# Patient Record
Sex: Male | Born: 1980 | Race: Black or African American | Hispanic: No | Marital: Single | Smoking: Never smoker
Health system: Southern US, Community
[De-identification: ages and names within clinical notes are randomized; demographics above are authoritative.]

## PROBLEM LIST (undated history)

## (undated) DIAGNOSIS — J45909 Unspecified asthma, uncomplicated: Secondary | ICD-10-CM

## (undated) DIAGNOSIS — M549 Dorsalgia, unspecified: Secondary | ICD-10-CM

## (undated) DEATH — deceased

---

## 2013-02-16 ENCOUNTER — Emergency Department: Payer: Self-pay | Admitting: Emergency Medicine

## 2013-03-13 ENCOUNTER — Emergency Department: Payer: Self-pay | Admitting: Emergency Medicine

## 2013-05-02 ENCOUNTER — Emergency Department: Payer: Self-pay | Admitting: Emergency Medicine

## 2013-06-12 ENCOUNTER — Emergency Department: Payer: Self-pay | Admitting: Emergency Medicine

## 2013-06-12 LAB — URINALYSIS, COMPLETE
Bilirubin,UR: NEGATIVE
Glucose,UR: NEGATIVE mg/dL (ref 0–75)
Ketone: NEGATIVE
Protein: NEGATIVE
RBC,UR: NONE SEEN /HPF (ref 0–5)
Specific Gravity: 1.03 (ref 1.003–1.030)
Squamous Epithelial: <1

## 2013-06-12 LAB — CBC
HCT: 42.9 % (ref 40.0–52.0)
HGB: 15 g/dL (ref 13.0–18.0)
MCH: 30.4 pg (ref 26.0–34.0)
MCHC: 34.9 g/dL (ref 32.0–36.0)
Platelet: 248 10*3/uL (ref 150–440)
RDW: 13 % (ref 11.5–14.5)
WBC: 7.8 10*3/uL (ref 3.8–10.6)

## 2013-06-12 LAB — COMPREHENSIVE METABOLIC PANEL
Albumin: 4.1 g/dL (ref 3.4–5.0)
Alkaline Phosphatase: 75 U/L (ref 50–136)
Anion Gap: 10 (ref 7–16)
BUN: 20 mg/dL — ABNORMAL HIGH (ref 7–18)
Bilirubin,Total: 0.6 mg/dL (ref 0.2–1.0)
Calcium, Total: 9.1 mg/dL (ref 8.5–10.1)
Chloride: 105 mmol/L (ref 98–107)
Co2: 26 mmol/L (ref 21–32)
Creatinine: 1.18 mg/dL (ref 0.60–1.30)
EGFR (African American): >60
EGFR (Non-African Amer.): >60
Glucose: 107 mg/dL — ABNORMAL HIGH (ref 65–99)
Potassium: 3.6 mmol/L (ref 3.5–5.1)
SGOT(AST): 27 U/L (ref 15–37)
Sodium: 141 mmol/L (ref 136–145)
Total Protein: 7.1 g/dL (ref 6.4–8.2)

## 2013-06-12 LAB — LIPASE, BLOOD: Lipase: 881 U/L — ABNORMAL HIGH (ref 73–393)

## 2013-07-19 ENCOUNTER — Emergency Department: Payer: Self-pay | Admitting: Emergency Medicine

## 2013-08-15 ENCOUNTER — Emergency Department: Payer: Self-pay | Admitting: Emergency Medicine

## 2013-09-23 ENCOUNTER — Emergency Department: Payer: Self-pay | Admitting: Internal Medicine

## 2013-09-23 ENCOUNTER — Emergency Department: Payer: Self-pay | Admitting: Emergency Medicine

## 2013-09-29 ENCOUNTER — Emergency Department: Payer: Self-pay | Admitting: Emergency Medicine

## 2013-10-21 ENCOUNTER — Emergency Department: Payer: Self-pay | Admitting: Emergency Medicine

## 2013-10-21 LAB — BASIC METABOLIC PANEL
Calcium, Total: 8.8 mg/dL (ref 8.5–10.1)
Chloride: 109 mmol/L — ABNORMAL HIGH (ref 98–107)
Co2: 26 mmol/L (ref 21–32)
Creatinine: 1.06 mg/dL (ref 0.60–1.30)
EGFR (African American): >60
EGFR (Non-African Amer.): >60
Osmolality: 281 (ref 275–301)
Potassium: 3.7 mmol/L (ref 3.5–5.1)
Sodium: 141 mmol/L (ref 136–145)

## 2013-10-21 LAB — CBC
HCT: 42.2 % (ref 40.0–52.0)
HGB: 14.6 g/dL (ref 13.0–18.0)
MCHC: 34.6 g/dL (ref 32.0–36.0)
Platelet: 266 10*3/uL (ref 150–440)
WBC: 8.1 10*3/uL (ref 3.8–10.6)

## 2013-10-21 LAB — TROPONIN I: Troponin-I: <0.02 ng/mL

## 2013-11-17 ENCOUNTER — Emergency Department: Payer: Self-pay | Admitting: Emergency Medicine

## 2013-12-27 ENCOUNTER — Emergency Department: Payer: Self-pay | Admitting: Emergency Medicine

## 2014-06-10 ENCOUNTER — Emergency Department: Payer: Self-pay | Admitting: Emergency Medicine

## 2014-06-11 ENCOUNTER — Emergency Department: Payer: Self-pay | Admitting: Emergency Medicine

## 2014-07-02 IMAGING — CR DG CHEST 2V
1 series · 3 of 3 positions shown · non-contrast
Comparison: none

REASON FOR EXAM: productive cough
COMMENTS:

PROCEDURE:     DXR - DXR CHEST PA (OR AP) AND LATERAL  - May 02, 2013  [DATE]
RESULT:     The lungs are clear. The cardiac silhouette and visualized bony
skeleton are unremarkable.

[Series 1: pa · 0.17mm/px · 3 of 3 slices shown]
[im 1/3]
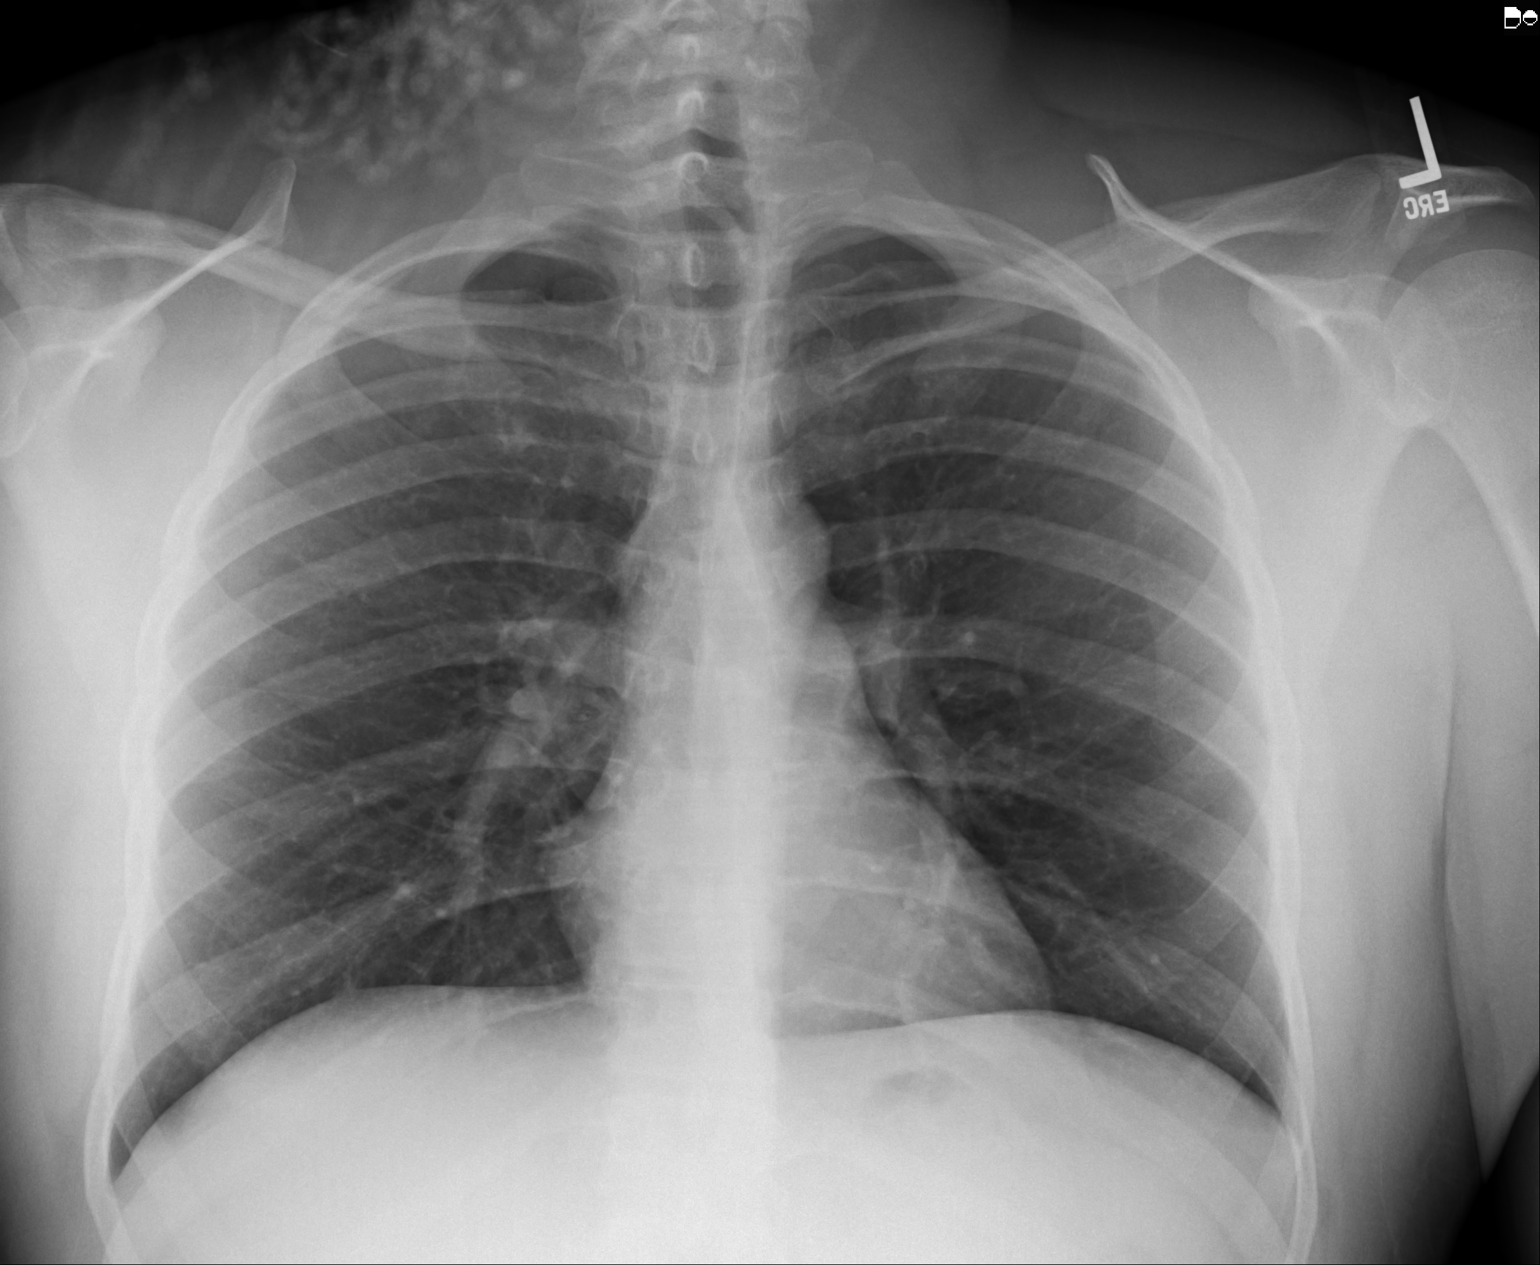
[im 2/3]
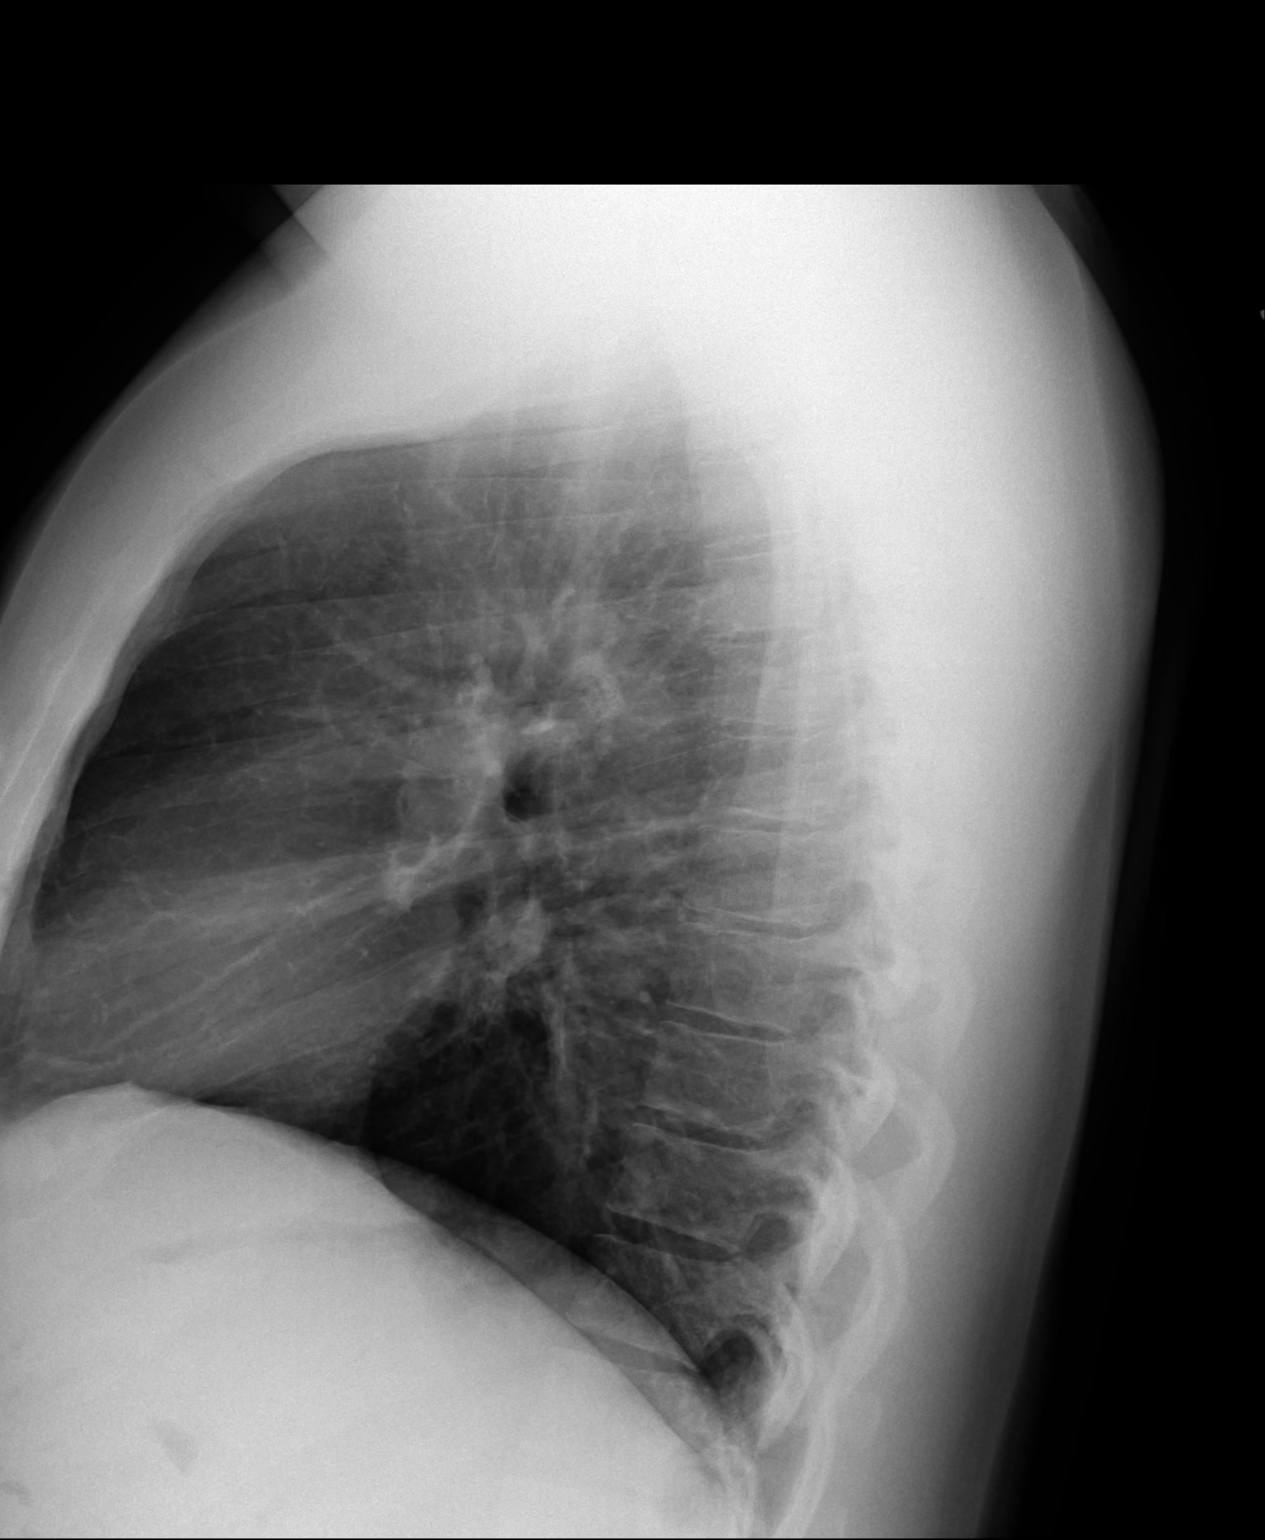
[im 3/3]
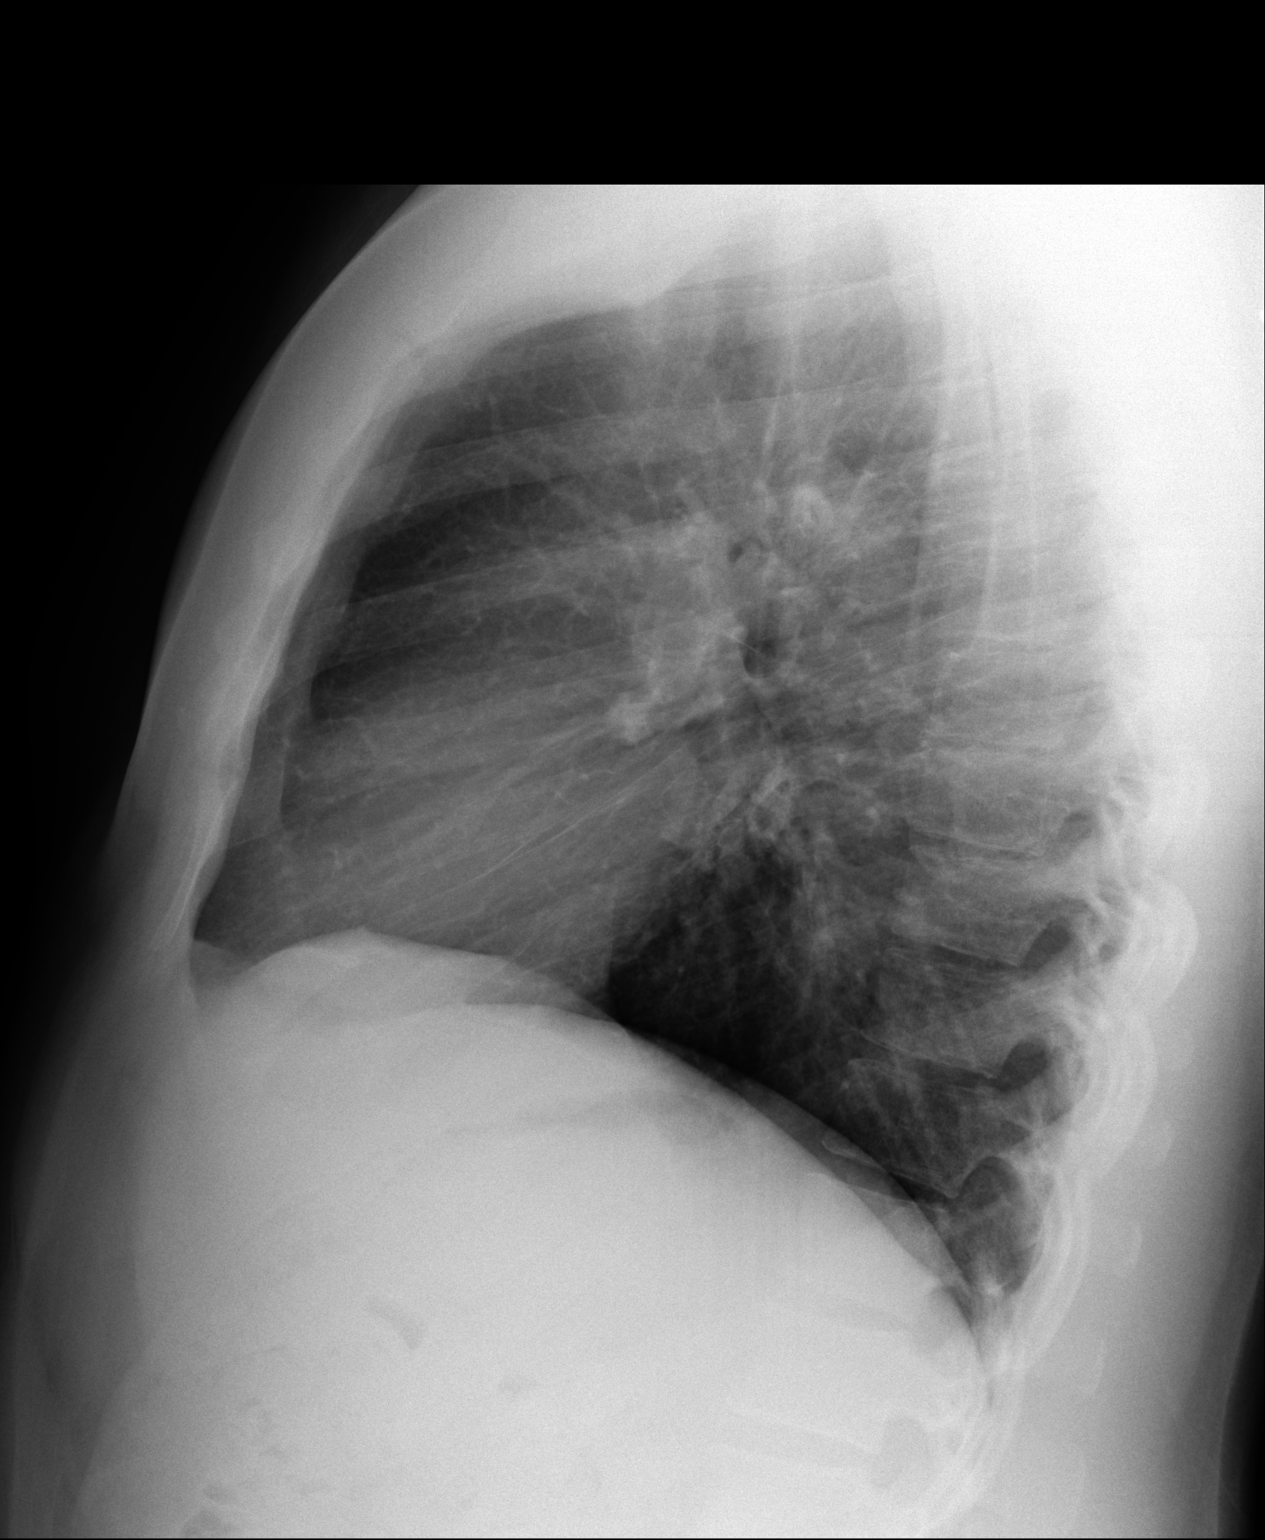

[3 of 3 positions shown; findings below may reference images not displayed]

IMPRESSION: 1. Chest radiograph without evidence of acute cardiopulmonary disease.

## 2014-11-29 IMAGING — CR DG CHEST 2V
1 series · 3 of 3 positions shown · non-contrast
Comparison: none

REASON FOR EXAM: wheezing    flex 41
COMMENTS:   LMP: (Male)

PROCEDURE:     DXR - DXR CHEST PA (OR AP) AND LATERAL  - September 29, 2013  [DATE]
RESULT:     Comparison: 05/02/2013

[Series 1: pa · 0.17mm/px · 3 of 3 slices shown]
[im 1/3]
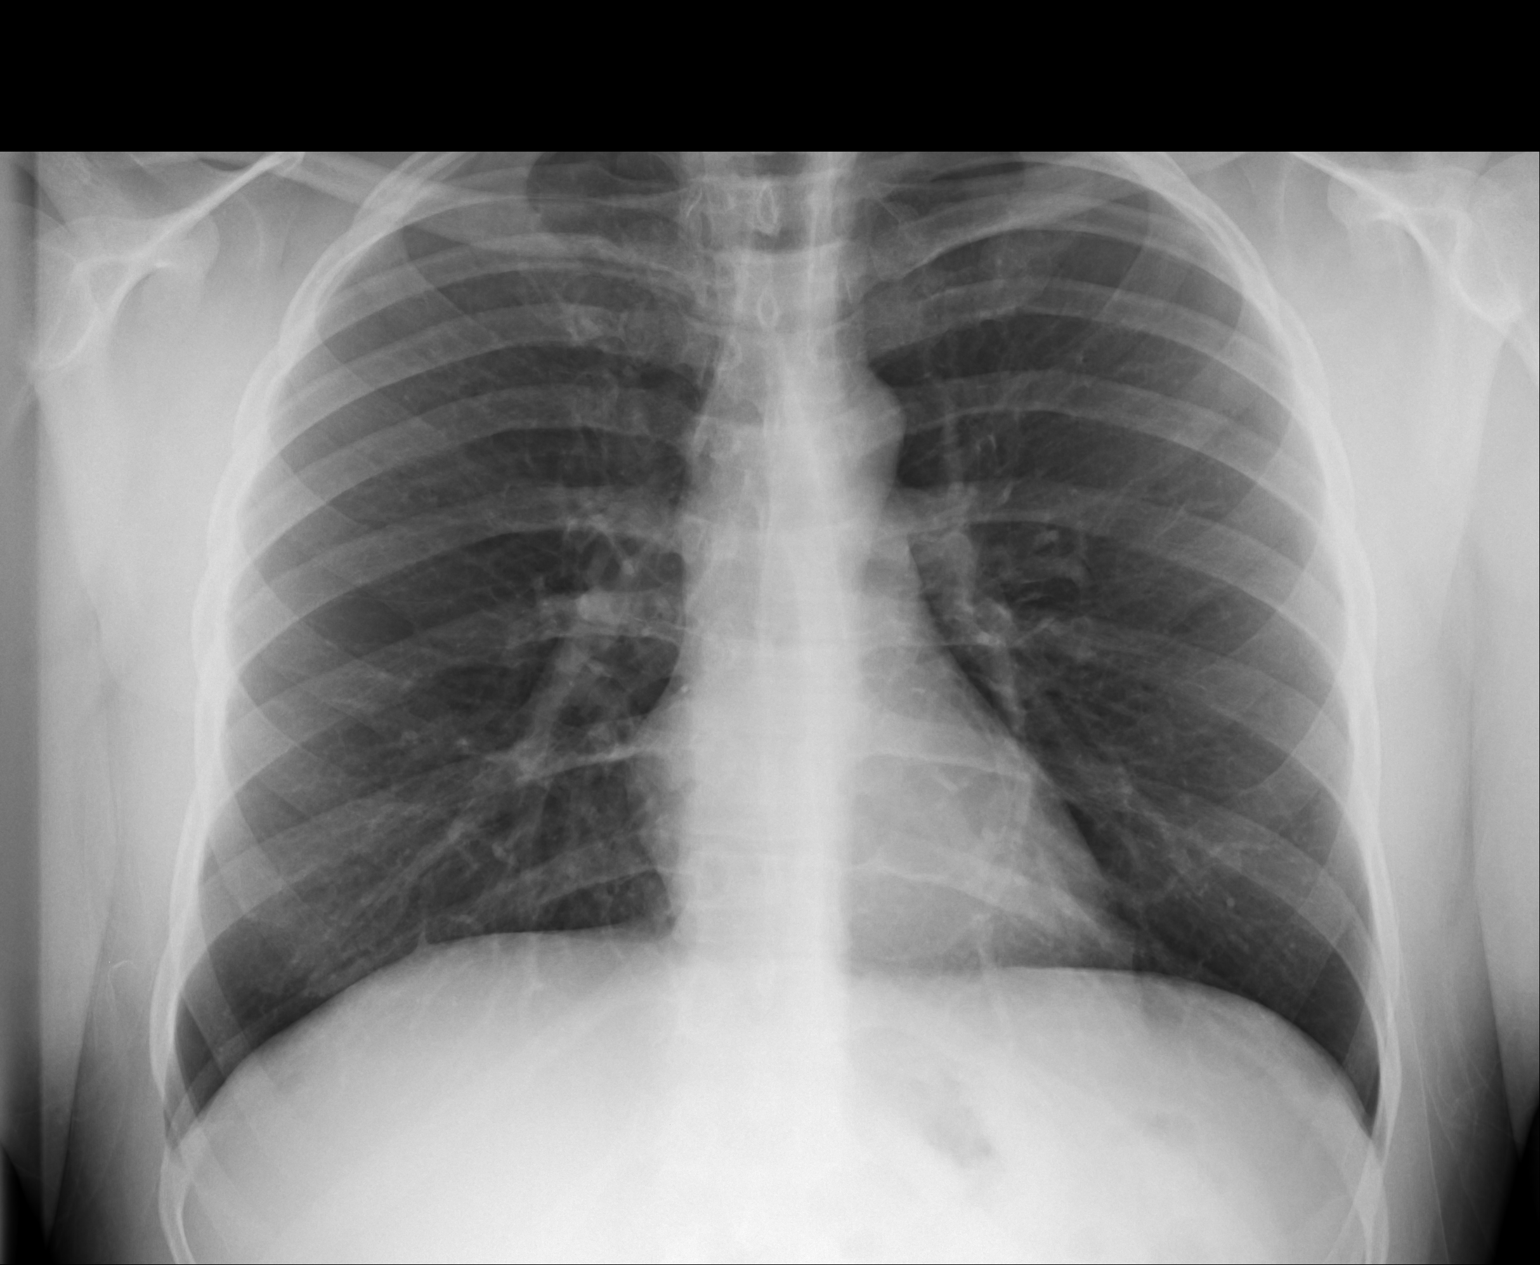
[im 2/3]
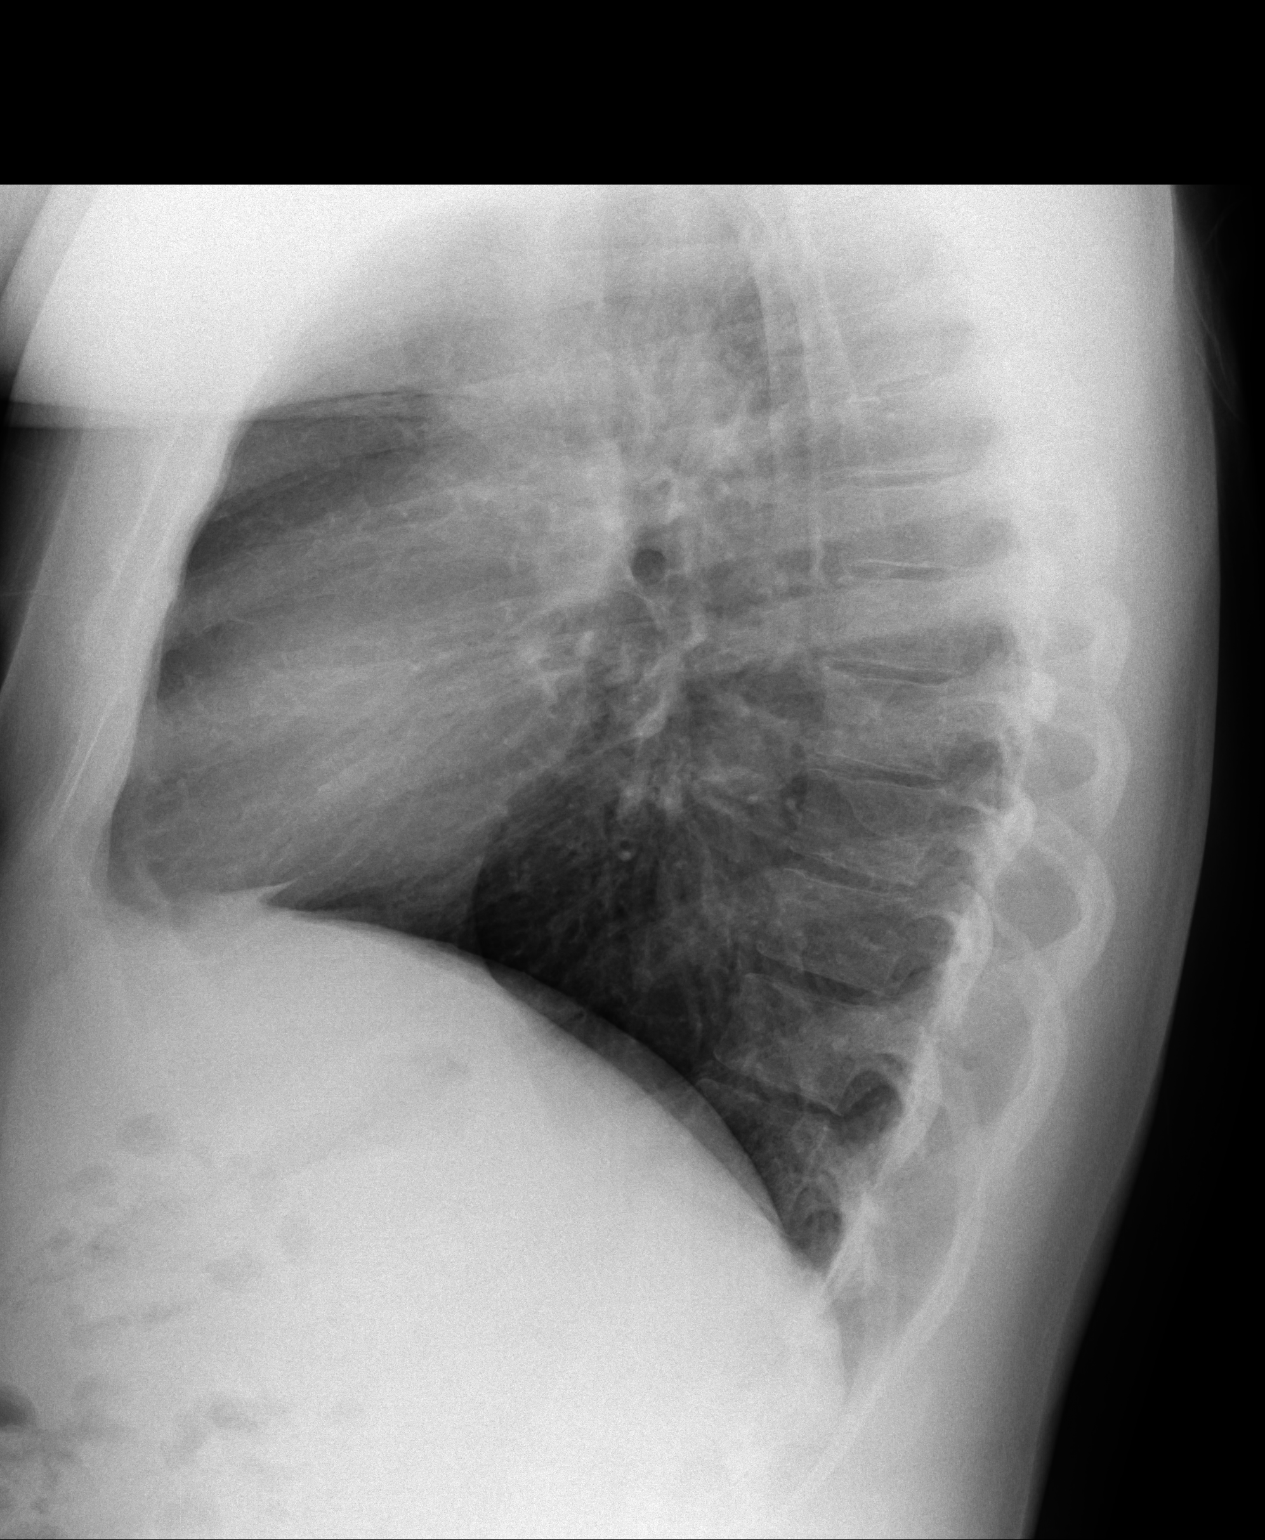
[im 3/3]
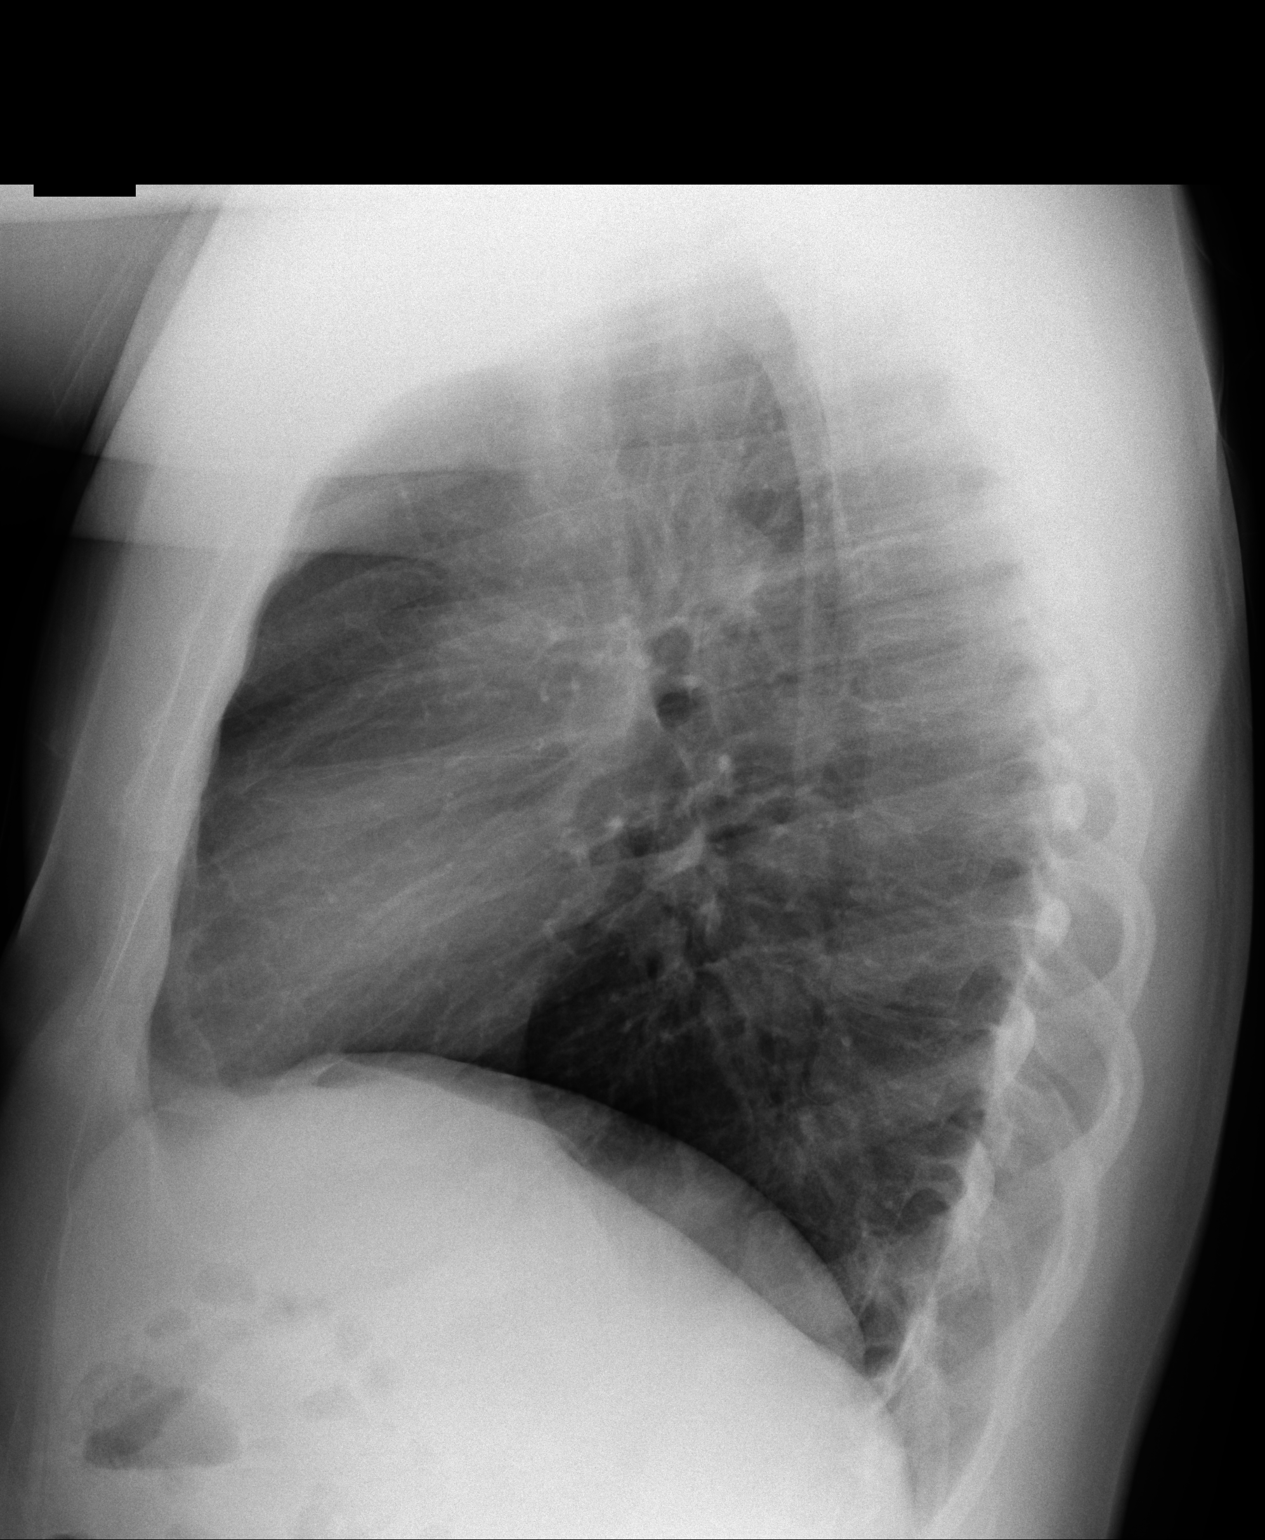

[3 of 3 positions shown; findings below may reference images not displayed]

FINDINGS: PA and lateral chest radiographs are provided.  There is no focal
parenchymal opacity, pleural effusion, or pneumothorax. The heart and
mediastinum are unremarkable.  The osseous structures are unremarkable.
IMPRESSION: No acute disease of the che[REDACTED]

## 2014-12-21 IMAGING — CR DG CHEST 2V
1 series · 2 of 2 positions shown · non-contrast
Comparison: none

REASON FOR EXAM: dyspnea
COMMENTS:

PROCEDURE:     DXR - DXR CHEST PA (OR AP) AND LATERAL  - October 21, 2013 [DATE]
RESULT:     The lungs are clear. The cardiac silhouette and visualized bony
skeleton are unremarkable.

[Series 1: w chest pa · 0.14mm/px · 2 of 2 slices shown]
[im 1/2]
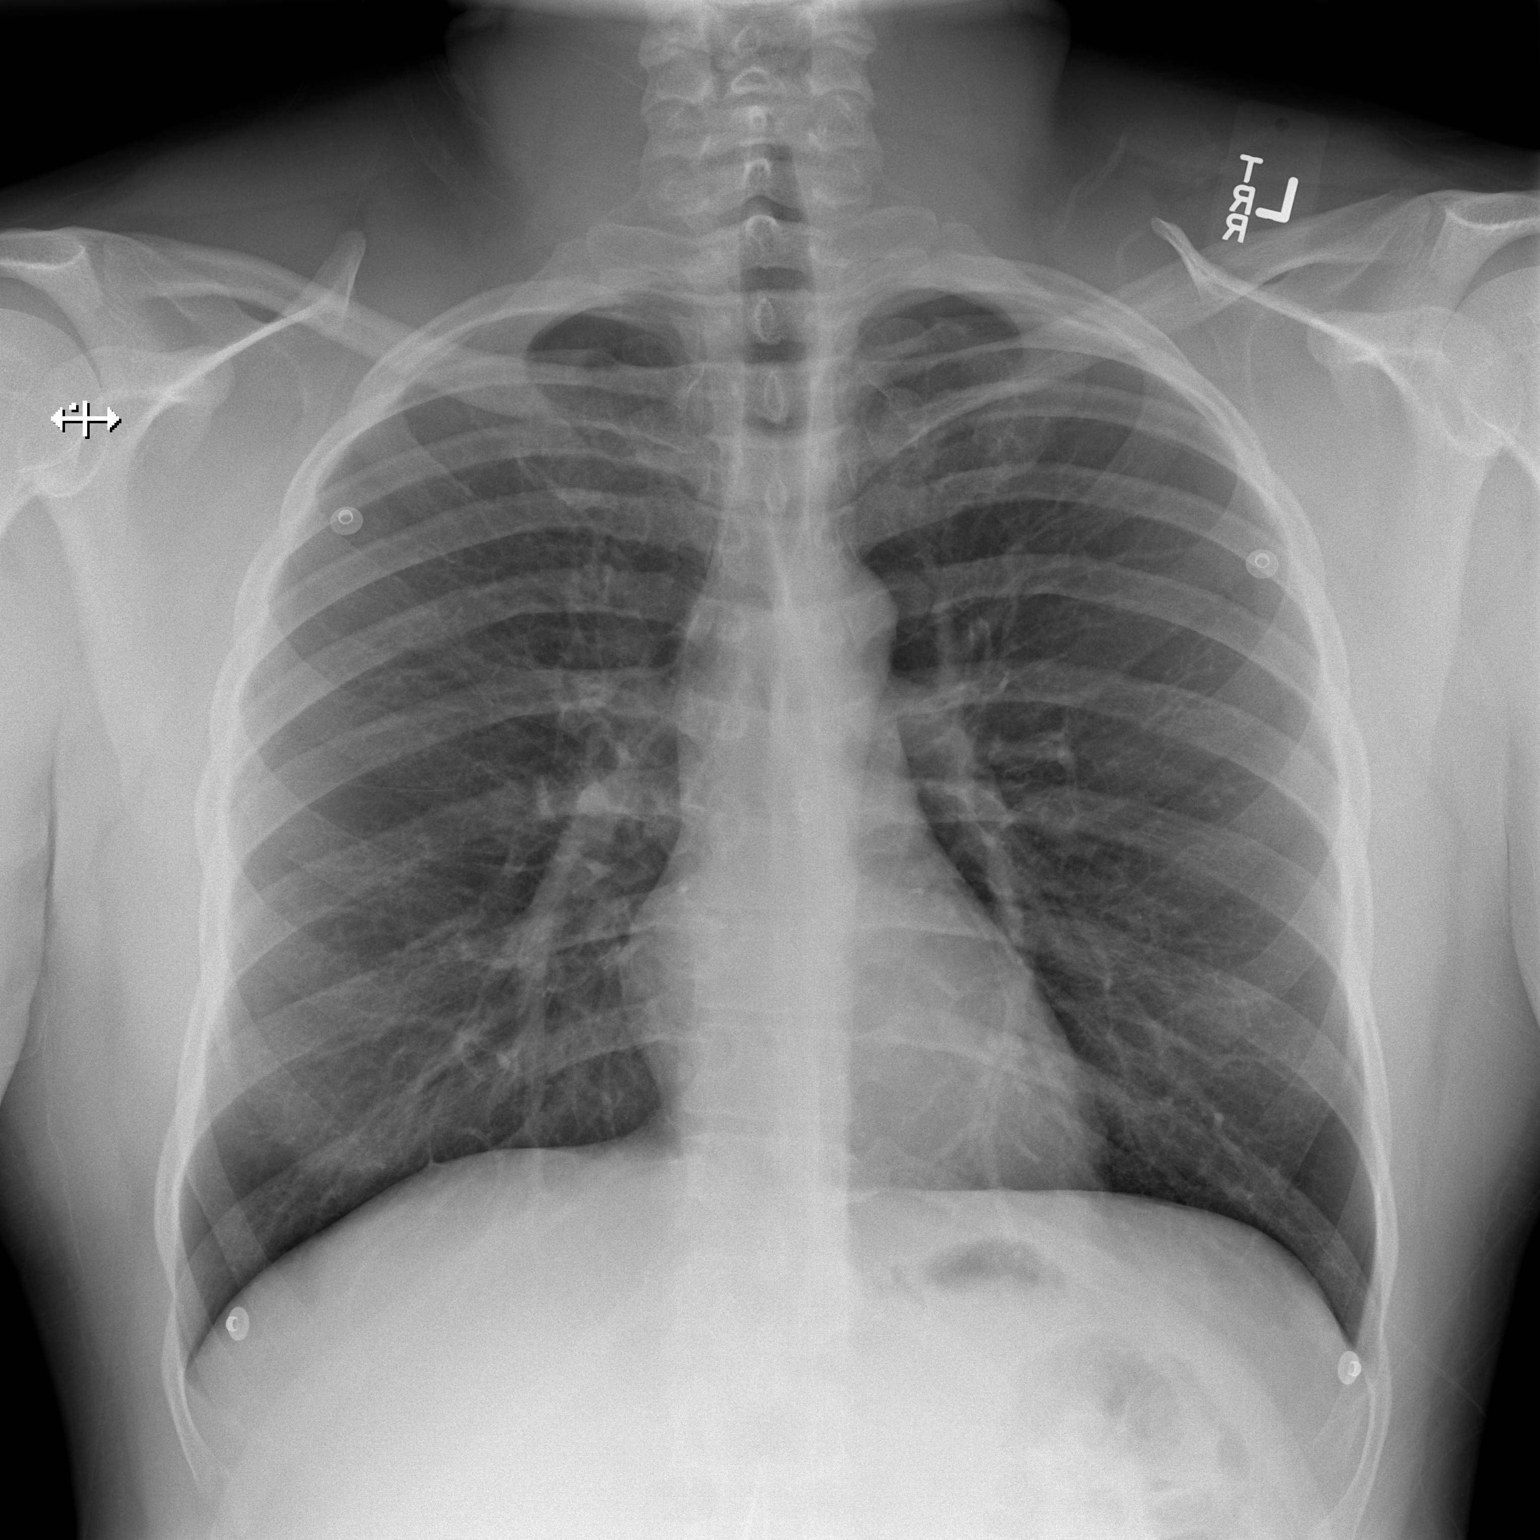
[im 2/2]
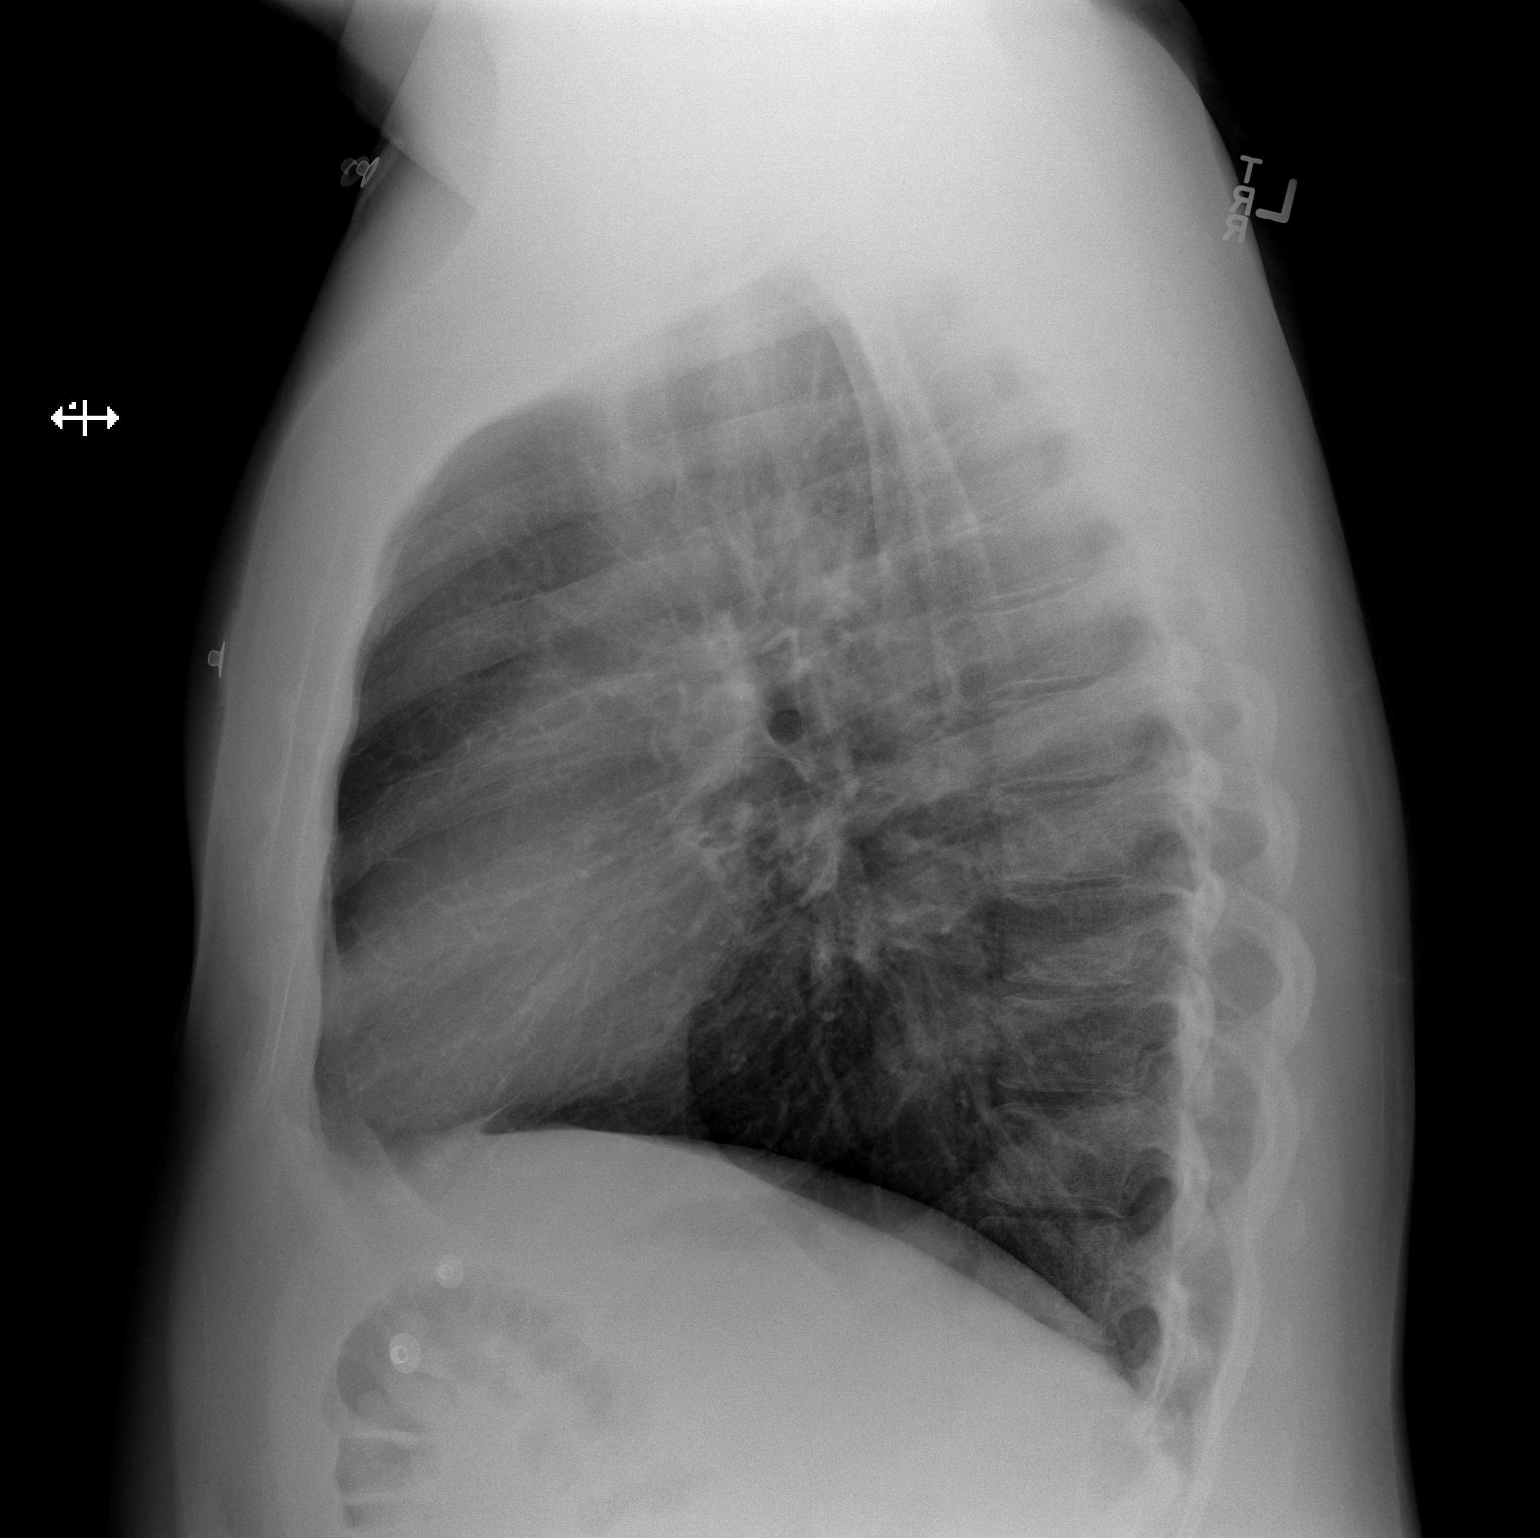

[2 of 2 positions shown; findings below may reference images not displayed]

IMPRESSION: 1. Chest radiograph without evidence of acute cardiopulmonary disease.
2. Comparison 09/29/2013

## 2015-09-09 ENCOUNTER — Encounter (HOSPITAL_COMMUNITY): Payer: Self-pay | Admitting: Emergency Medicine

## 2015-09-09 ENCOUNTER — Emergency Department (HOSPITAL_COMMUNITY)
Admission: EM | Admit: 2015-09-09 | Discharge: 2015-09-09 | Disposition: A | Discharge status: Home / Self Care | Payer: Self-pay | Attending: Emergency Medicine | Admitting: Emergency Medicine

## 2015-09-09 DIAGNOSIS — J45901 Unspecified asthma with (acute) exacerbation: Secondary | ICD-10-CM | POA: Insufficient documentation

## 2015-09-09 HISTORY — DX: Dorsalgia, unspecified: M54.9

## 2015-09-09 HISTORY — DX: Unspecified asthma, uncomplicated: J45.909

## 2015-09-09 MED ORDER — ALBUTEROL SULFATE HFA 108 (90 BASE) MCG/ACT IN AERS
2.0000 | INHALATION_SPRAY | RESPIRATORY_TRACT | Status: DC | PRN
  Administered 2015-09-09: 2 via RESPIRATORY_TRACT
  Filled 2015-09-09: qty 6.7

## 2015-09-09 NOTE — ED Provider Notes (Signed)
CSN: [id number]     Arrival date & time 09/09/15  1446 History  This chart was scribed for Rhea Bleacher, working with Eber Hong, MD by Chestine Spore, ED Scribe. The patient was seen in room TR06C/TR06C at 3:09 PM.    Chief Complaint  Patient presents with  . Asthma    The history is provided by the patient. No language interpreter was used.    HPI Comments: John Duncan is a 34 y.o. male with a medical hx of asthma who presents to the Emergency Department complaining of asthma onset 2-3 AM this morning. He reports that he is out of his albuterol inhaler and he would like a new one today. He reports that he has a Rx for an inhaler and prednisone but he left it at home before travel. He states that he is having associated symptoms of chest wall tightness, productive cough. He denies rhinorhea, sore throat, fever, congestion, and any other symptoms.   Past Medical History  Diagnosis Date  . Asthma   . Back pain    History reviewed. No pertinent past surgical history. No family history on file. Social History  Substance Use Topics  . Smoking status: Never Smoker   . Smokeless tobacco: None  . Alcohol Use: No    Review of Systems  Constitutional: Negative for fever.  HENT: Negative for congestion, rhinorrhea and sore throat.   Eyes: Negative for redness.  Respiratory: Positive for cough. Negative for shortness of breath.   Cardiovascular: Negative for chest pain.  Gastrointestinal: Negative for nausea and vomiting.      Allergies  Review of patient's allergies indicates no known allergies.  Home Medications   Prior to Admission medications   Not on File   BP 118/67 mmHg  Pulse 61  Temp(Src) 98.3 F (36.8 C) (Oral)  Resp 18  Ht  (1.753 m)  Wt 236 lb (107.049 kg)  BMI 34.84 kg/m2  SpO2 98%   Physical Exam  Constitutional: He appears well-developed and well-nourished.  HENT:  Head: Normocephalic and atraumatic.  Eyes: Conjunctivae are normal.  Neck:  Normal range of motion. Neck supple.  Cardiovascular: Normal rate and regular rhythm.   No murmur heard. Pulmonary/Chest: Effort normal. No respiratory distress. He has wheezes (Mild scattered expiratory wheezing). He has no rales.  Neurological: He is alert.  Skin: Skin is warm and dry.  Psychiatric: He has a normal mood and affect.  Nursing note and vitals reviewed.   ED Course  Procedures (including critical care time) DIAGNOSTIC STUDIES: Oxygen Saturation is 98% on RA, nl by my interpretation.    COORDINATION OF CARE: 3:12 PM Discussed treatment plan with pt at bedside which includes albuterol inhaler and pt agreed to plan.  Vital signs reviewed and are as follows: Filed Vitals:   09/09/15 1500  BP: 118/67  Pulse: 61  Temp: 98.3 F (36.8 C)  Resp: 18   3:35 PM Patient counseled on use of albuterol HFA. Instructed to use 1-2 puffs q 4 hours as needed for SOB. Patient states that he does not feel that he needs a breathing treatment here or that he needs prednisone for home.    MDM   Final diagnoses:  Asthma exacerbation   Patient with mild asthma exacerbation. He appears well.  I personally performed the services described in this documentation, which was scribed in my presence. The recorded information has been reviewed and is accurate.    Renne Crigler, PA-C 09/09/15 1536  Eber Hong, MD  09/09/15 1650 

## 2015-09-09 NOTE — Discharge Instructions (Signed)
Please read and follow all provided instructions.  Your diagnoses today include:  1. Asthma exacerbation    Tests performed today include:  Vital signs. See below for your results today.   Medications prescribed:   Albuterol inhaler - medication that opens up your airway  Use inhaler as follows: 1-2 puffs with spacer every 4 hours as needed for wheezing, cough, or shortness of breath.   Take any prescribed medications only as directed.  Home care instructions:  Follow any educational materials contained in this packet.  Follow-up instructions: Please follow-up with your primary care provider in the next 3 days for further evaluation of your symptoms and management of your asthma.  Return instructions:   Please return to the Emergency Department if you experience worsening symptoms.  Please return with worsening wheezing, shortness of breath, or difficulty breathing.  Return with persistent fever above 101F.   Please return if you have any other emergent concerns.  Additional Information:  Your vital signs today were: BP 118/67 mmHg   Pulse 61   Temp(Src) 98.3 F (36.8 C) (Oral)   Resp 18   Ht  (1.753 m)   Wt 236 lb (107.049 kg)   BMI 34.84 kg/m2   SpO2 98% If your blood pressure (BP) was elevated above 135/85 this visit, please have this repeated by your doctor within one month. --------------

## 2015-09-09 NOTE — ED Notes (Addendum)
Pt c/o asthma symtoms onset about 2-3 this morning. Pt talking in complete sentences without difficulty. Pt requesting an albuterol inhaler.

## 2015-12-15 ENCOUNTER — Emergency Department
Admission: EM | Admit: 2015-12-15 | Discharge: 2015-12-15 | Disposition: A | Discharge status: Home / Self Care | Payer: No Typology Code available for payment source | Attending: Emergency Medicine | Admitting: Emergency Medicine

## 2015-12-15 ENCOUNTER — Emergency Department: Payer: No Typology Code available for payment source

## 2015-12-15 DIAGNOSIS — Y92410 Unspecified street and highway as the place of occurrence of the external cause: Secondary | ICD-10-CM | POA: Diagnosis not present

## 2015-12-15 DIAGNOSIS — M545 Low back pain, unspecified: Secondary | ICD-10-CM

## 2015-12-15 DIAGNOSIS — Z8739 Personal history of other diseases of the musculoskeletal system and connective tissue: Secondary | ICD-10-CM | POA: Insufficient documentation

## 2015-12-15 DIAGNOSIS — Y9389 Activity, other specified: Secondary | ICD-10-CM | POA: Diagnosis not present

## 2015-12-15 DIAGNOSIS — Y998 Other external cause status: Secondary | ICD-10-CM | POA: Insufficient documentation

## 2015-12-15 DIAGNOSIS — S3992XA Unspecified injury of lower back, initial encounter: Secondary | ICD-10-CM | POA: Diagnosis not present

## 2015-12-15 DIAGNOSIS — Z7951 Long term (current) use of inhaled steroids: Secondary | ICD-10-CM | POA: Diagnosis not present

## 2015-12-15 NOTE — Discharge Instructions (Signed)
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
## 2017-02-24 ENCOUNTER — Encounter (INDEPENDENT_AMBULATORY_CARE_PROVIDER_SITE_OTHER): Payer: Self-pay

## 2017-02-24 ENCOUNTER — Encounter (INDEPENDENT_AMBULATORY_CARE_PROVIDER_SITE_OTHER): Payer: No Typology Code available for payment source | Admitting: Allergy/Immunology

## 2017-02-24 NOTE — Progress Notes (Deleted)
Referring provider: Dr. Glenice Bow    CC: We were kindly asked to evaluate Jamie Ayala regarding urticaria.    HPI: Patient is a pleasant 36 year old male    Symptoms often worsen when he eats bananas and fish.    HPI: *** is a very pleasant *** year old *** presenting for initial evaluation of rash.  *** notes that *** *** ago he developed symptoms of red, raised pruritic lesions on various parts of *** body.  Rash usually lasts for several hours at a time and resolves.  Denies any wheezing, chest tightness, shortness of breath, lightheadedness associated with the rash.  Symptoms have been present for several months and come on most days.  Only notes itching, no pain associated with the rash.    Denies any other associated symptoms including no joint pain, fevers, chills, change in energy level, weight loss or weight gain.  Has tried some antihistamines and it does improve the itch.  Denies starting any new medications or supplements when the rash started.  No change in *** diet or new lotions or creams.      Otherwise *** is very healthy, does not take any medications regularly.  No other triggers *** has identified in regards to this rash.    No history of seasonal allergies or asthma.  No history of food allergies and no foods worsen the rash every time *** eats them.    No outpatient prescriptions prior to visit.     No facility-administered medications prior to visit.        No past medical history on file.    Social History     Social History   . Marital status: Single     Spouse name: N/A   . Number of children: N/A   . Years of education: N/A     Occupational History   . Not on file.     Social History Main Topics   . Smoking status: Not on file   . Smokeless tobacco: Not on file   . Alcohol use Not on file   . Drug use: Unknown   . Sexual activity: Not on file     Other Topics Concern   . Not on file     Social History Narrative   . No narrative on file           Review of patient's allergies  indicates:  Allergies not on file    Review of Systems   Constitutional: Negative for fever.   HENT: Negative for congestion, sore throat, rhinorrhea, sneezing, postnasal drip, sinus pressure and tinnitus.    Eyes: Negative for pain, discharge, redness and itching.   Respiratory: Negative for cough, chest tightness, shortness of breath and wheezing.    Cardiovascular: Negative for chest pain.   Gastrointestinal: Negative for nausea and abdominal pain.   Endocrine: Negative for cold intolerance.   Musculoskeletal: Negative for joint swelling.   Skin: Negative for color change and rash.   Neurological: Negative for dizziness and syncope.   Hematological: Negative for adenopathy.    Physical Exam:  There were no vitals taken for this visit.  Gen: Alert and Oriented NAD,   HEENT: PERRL, nasal mucosa non-erythematous, non-boggy   CV: RRR, normal S1 and S2  Resp: CTA, no wheezes or crackles  Abd: Soft and NT  Ext: No c/c/e      IMPRESSION/RECOMMENDATIONS    1) Chronic urticaria- Symptoms are likely to chronic urticaria, which is most often thought to  be due to an internal cause (often autoimmune) and not due to any external triggers. No other symptoms concerning for another systemic process and no clear environmental triggers, thus no further workup necessary at this time.  The persistent urticaria is not consistent with an IgE mediated hypersensitivity, thus skin testing was held today.  Laboratory evaluation in this condition very rarely leads to change in management, thus a laboratory workup was held today (Tarbox et al).  Discussed that this condition will usually resolve if symptoms can be controlled long enough.  If *** develops any other associated symptoms such as joint pain, swelling, fevers, chills, unintentional weight loss or weight gain or if the rash lasts greater than 24 hours, to follow up immediately.  Also discussed that we have many different treatments for this condition and can start those medications  if symptoms not controlled on this regimen.    Recommendations:  --Start cetirizine 10 mg 1 tab BID, can increase to 2 tabs BID as this is a very safe medication  --If cetirizine causes sedation then can switch to fexofenadine and was given information on obtaining this medication  --Can use diphenhydramine at night if necessary for itch.  --Was given a handout on chronic urticaria today.    --To follow up in 3-4 weeks, discussed that if this does not control *** symptoms, we have many other medications that we can try but this has the least side effects.        Tarbox JA, Gutta RC, Radojicic C, Lang DM. Utility of routine laboratory testing in management of chronic urticaria/angioedema. Ann Allergy Asthma Immunol. 2011 Sep;107(3):239-43.

## 2018-01-18 ENCOUNTER — Emergency Department (HOSPITAL_BASED_OUTPATIENT_CLINIC_OR_DEPARTMENT_OTHER)
Admission: EM | Admit: 2018-01-18 | Discharge: 2018-01-18 | Disposition: A | Discharge status: Home / Self Care | Payer: No Typology Code available for payment source | Attending: Emergency Medicine | Admitting: Emergency Medicine

## 2018-01-18 DIAGNOSIS — J45901 Unspecified asthma with (acute) exacerbation: Secondary | ICD-10-CM

## 2018-02-02 ENCOUNTER — Ambulatory Visit (HOSPITAL_BASED_OUTPATIENT_CLINIC_OR_DEPARTMENT_OTHER): Payer: Self-pay

## 2018-03-29 ENCOUNTER — Inpatient Hospital Stay: Payer: Self-pay

## 2018-04-22 ENCOUNTER — Inpatient Hospital Stay: Payer: Self-pay

## 2018-05-05 ENCOUNTER — Inpatient Hospital Stay: Payer: Self-pay

## 2018-05-10 ENCOUNTER — Inpatient Hospital Stay: Payer: Self-pay

## 2018-05-12 ENCOUNTER — Inpatient Hospital Stay: Payer: Self-pay

## 2018-06-28 ENCOUNTER — Inpatient Hospital Stay: Payer: Self-pay

## 2018-07-18 ENCOUNTER — Inpatient Hospital Stay: Payer: Self-pay

## 2018-07-24 ENCOUNTER — Inpatient Hospital Stay: Payer: Self-pay

## 2018-07-26 ENCOUNTER — Inpatient Hospital Stay: Payer: Self-pay

## 2018-08-11 ENCOUNTER — Inpatient Hospital Stay: Payer: Self-pay

## 2018-08-28 ENCOUNTER — Inpatient Hospital Stay: Payer: Self-pay

## 2018-09-20 ENCOUNTER — Inpatient Hospital Stay: Payer: Self-pay

## 2018-10-15 ENCOUNTER — Inpatient Hospital Stay: Payer: Self-pay

## 2018-10-23 ENCOUNTER — Inpatient Hospital Stay: Payer: Self-pay

## 2018-11-11 ENCOUNTER — Inpatient Hospital Stay: Payer: Self-pay

## 2018-12-09 ENCOUNTER — Inpatient Hospital Stay: Payer: Self-pay

## 2018-12-31 ENCOUNTER — Inpatient Hospital Stay: Payer: Self-pay

## 2019-05-21 ENCOUNTER — Inpatient Hospital Stay: Payer: Self-pay

## 2020-03-24 ENCOUNTER — Emergency Department: Payer: Self-pay

## 2020-06-11 ENCOUNTER — Emergency Department: Payer: Self-pay

## 2020-06-26 ENCOUNTER — Emergency Department: Payer: Self-pay

## 2020-07-07 ENCOUNTER — Emergency Department: Payer: Self-pay

## 2020-07-09 ENCOUNTER — Emergency Department: Payer: Self-pay

## 2020-08-26 ENCOUNTER — Emergency Department: Payer: Self-pay
